# Patient Record
Sex: Male | Born: 1955 | Race: Black or African American | Hispanic: No | Marital: Married | Smoking: Never smoker
Health system: Southern US, Community
[De-identification: ages and names within clinical notes are randomized; demographics above are authoritative.]

## PROBLEM LIST (undated history)

## (undated) DIAGNOSIS — E119 Type 2 diabetes mellitus without complications: Secondary | ICD-10-CM

---

## 2015-07-24 ENCOUNTER — Encounter (HOSPITAL_COMMUNITY): Payer: Self-pay

## 2015-07-24 ENCOUNTER — Emergency Department (HOSPITAL_COMMUNITY)

## 2015-07-24 ENCOUNTER — Emergency Department (HOSPITAL_COMMUNITY)
Admission: EM | Admit: 2015-07-24 | Discharge: 2015-07-25 | Disposition: A | Discharge status: Home / Self Care | Attending: Emergency Medicine | Admitting: Emergency Medicine

## 2015-07-24 DIAGNOSIS — R300 Dysuria: Secondary | ICD-10-CM

## 2015-07-24 DIAGNOSIS — Z7982 Long term (current) use of aspirin: Secondary | ICD-10-CM | POA: Diagnosis not present

## 2015-07-24 DIAGNOSIS — E119 Type 2 diabetes mellitus without complications: Secondary | ICD-10-CM | POA: Insufficient documentation

## 2015-07-24 DIAGNOSIS — R000 Tachycardia, unspecified: Secondary | ICD-10-CM | POA: Diagnosis not present

## 2015-07-24 DIAGNOSIS — R1110 Vomiting, unspecified: Secondary | ICD-10-CM | POA: Diagnosis not present

## 2015-07-24 DIAGNOSIS — N419 Inflammatory disease of prostate, unspecified: Secondary | ICD-10-CM | POA: Insufficient documentation

## 2015-07-24 DIAGNOSIS — N390 Urinary tract infection, site not specified: Secondary | ICD-10-CM | POA: Diagnosis not present

## 2015-07-24 DIAGNOSIS — R32 Unspecified urinary incontinence: Secondary | ICD-10-CM | POA: Diagnosis present

## 2015-07-24 DIAGNOSIS — Z7984 Long term (current) use of oral hypoglycemic drugs: Secondary | ICD-10-CM | POA: Diagnosis not present

## 2015-07-24 DIAGNOSIS — Z79899 Other long term (current) drug therapy: Secondary | ICD-10-CM | POA: Insufficient documentation

## 2015-07-24 HISTORY — DX: Type 2 diabetes mellitus without complications: E11.9

## 2015-07-24 LAB — BASIC METABOLIC PANEL
Anion gap: 15 (ref 5–15)
BUN: 16 mg/dL (ref 6–20)
CALCIUM: 9.3 mg/dL (ref 8.9–10.3)
CO2: 21 mmol/L — ABNORMAL LOW (ref 22–32)
Chloride: 100 mmol/L — ABNORMAL LOW (ref 101–111)
Creatinine, Ser: 1.15 mg/dL (ref 0.61–1.24)
GFR calc Af Amer: >60 mL/min (ref >60)
GLUCOSE: 278 mg/dL — AB (ref 65–99)
Potassium: 3.7 mmol/L (ref 3.5–5.1)
Sodium: 136 mmol/L (ref 135–145)

## 2015-07-24 LAB — URINE MICROSCOPIC-ADD ON: SQUAMOUS EPITHELIAL / LPF: NONE SEEN

## 2015-07-24 LAB — I-STAT CHEM 8, ED
BUN: 14 mg/dL (ref 6–20)
CALCIUM ION: 1.1 mmol/L — AB (ref 1.12–1.23)
CHLORIDE: 97 mmol/L — AB (ref 101–111)
Creatinine, Ser: 1 mg/dL (ref 0.61–1.24)
GLUCOSE: 275 mg/dL — AB (ref 65–99)
HCT: 47 % (ref 39.0–52.0)
HEMOGLOBIN: 16 g/dL (ref 13.0–17.0)
Potassium: 3.5 mmol/L (ref 3.5–5.1)
Sodium: 135 mmol/L (ref 135–145)
TCO2: 22 mmol/L (ref 0–100)

## 2015-07-24 LAB — CBC WITH DIFFERENTIAL/PLATELET
Basophils Absolute: 0 10*3/uL (ref 0.0–0.1)
Basophils Relative: 0 %
EOS PCT: 0 %
Eosinophils Absolute: 0 10*3/uL (ref 0.0–0.7)
HCT: 40.9 % (ref 39.0–52.0)
Hemoglobin: 14.1 g/dL (ref 13.0–17.0)
LYMPHS ABS: 0.8 10*3/uL (ref 0.7–4.0)
LYMPHS PCT: 8 %
MCH: 29.3 pg (ref 26.0–34.0)
MCHC: 34.5 g/dL (ref 30.0–36.0)
MCV: 85 fL (ref 78.0–100.0)
MONO ABS: 0.5 10*3/uL (ref 0.1–1.0)
Monocytes Relative: 5 %
Neutro Abs: 8.8 10*3/uL — ABNORMAL HIGH (ref 1.7–7.7)
Neutrophils Relative %: 87 %
PLATELETS: 154 10*3/uL (ref 150–400)
RBC: 4.81 MIL/uL (ref 4.22–5.81)
RDW: 13.1 % (ref 11.5–15.5)
WBC: 10 10*3/uL (ref 4.0–10.5)

## 2015-07-24 LAB — URINALYSIS, ROUTINE W REFLEX MICROSCOPIC
Bilirubin Urine: NEGATIVE
Ketones, ur: >80 mg/dL — AB
Nitrite: POSITIVE — AB
Protein, ur: 100 mg/dL — AB
SPECIFIC GRAVITY, URINE: 1.04 — AB (ref 1.005–1.030)
pH: 5.5 (ref 5.0–8.0)

## 2015-07-24 MED ORDER — ACETAMINOPHEN 500 MG PO TABS
1000.0000 mg | ORAL_TABLET | Freq: Once | ORAL | Status: AC
  Administered 2015-07-24: 1000 mg via ORAL
  Filled 2015-07-24: qty 2

## 2015-07-24 MED ORDER — IOHEXOL 300 MG/ML  SOLN
25.0000 mL | Freq: Once | INTRAMUSCULAR | Status: AC | PRN
  Administered 2015-07-24: 25 mL via ORAL

## 2015-07-24 MED ORDER — SODIUM CHLORIDE 0.9 % IV BOLUS (SEPSIS)
1000.0000 mL | Freq: Once | INTRAVENOUS | Status: AC
  Administered 2015-07-25: 1000 mL via INTRAVENOUS

## 2015-07-24 MED ORDER — IBUPROFEN 800 MG PO TABS
800.0000 mg | ORAL_TABLET | Freq: Once | ORAL | Status: AC
  Administered 2015-07-24: 800 mg via ORAL
  Filled 2015-07-24: qty 1

## 2015-07-24 MED ORDER — IOHEXOL 300 MG/ML  SOLN
100.0000 mL | Freq: Once | INTRAMUSCULAR | Status: AC | PRN
  Administered 2015-07-24: 100 mL via INTRAVENOUS

## 2015-07-24 MED ORDER — LEVOFLOXACIN 750 MG PO TABS: 750.0000 mg | ORAL_TABLET | Freq: Every day | ORAL | Status: AC

## 2015-07-24 MED ORDER — SODIUM CHLORIDE 0.9 % IV BOLUS (SEPSIS)
1000.0000 mL | Freq: Once | INTRAVENOUS | Status: AC
  Administered 2015-07-24: 1000 mL via INTRAVENOUS

## 2015-07-24 MED ORDER — LEVOFLOXACIN 500 MG PO TABS
500.0000 mg | ORAL_TABLET | Freq: Once | ORAL | Status: AC
  Administered 2015-07-25: 500 mg via ORAL
  Filled 2015-07-24: qty 1

## 2015-07-24 MED ORDER — ONDANSETRON HCL 4 MG/2ML IJ SOLN
4.0000 mg | Freq: Once | INTRAMUSCULAR | Status: DC
  Filled 2015-07-24: qty 2

## 2015-07-24 NOTE — ED Notes (Signed)
Patient transported to CT 

## 2015-07-24 NOTE — Progress Notes (Signed)
Patient listed as not having a pcp or insurance.  Patient reports he has [employer] and his pcp is Dr[facility] A[facility] [place].  System updated.

## 2015-07-24 NOTE — Discharge Instructions (Signed)
Follow up with urology.  It is rare to have a UTI as a male, so you may need further workup.  Urinary Tract Infection Urinary tract infections (UTIs) can develop anywhere along your urinary tract. Your urinary tract is your body's drainage system for removing wastes and extra water. Your urinary tract includes two kidneys, two ureters, a bladder, and a urethra. Your kidneys are a pair of bean-shaped organs. Each kidney is about the size of your fist. They are located below your ribs, one on each side of your spine. CAUSES Infections are caused by microbes, which are microscopic organisms, including fungi, viruses, and bacteria. These organisms are so small that they can only be seen through a microscope. Bacteria are the microbes that most commonly cause UTIs. SYMPTOMS  Symptoms of UTIs may vary by age and gender of the patient and by the location of the infection. Symptoms in young women typically include a frequent and intense urge to urinate and a painful, burning feeling in the bladder or urethra during urination. Older women and men are more likely to be tired, shaky, and weak and have muscle aches and abdominal pain. A fever may mean the infection is in your kidneys. Other symptoms of a kidney infection include pain in your back or sides below the ribs, nausea, and vomiting. DIAGNOSIS To diagnose a UTI, your caregiver will ask you about your symptoms. Your caregiver will also ask you to provide a urine sample. The urine sample will be tested for bacteria and white blood cells. White blood cells are made by your body to help fight infection. TREATMENT  Typically, UTIs can be treated with medication. Because most UTIs are caused by a bacterial infection, they usually can be treated with the use of antibiotics. The choice of antibiotic and length of treatment depend on your symptoms and the type of bacteria causing your infection. HOME CARE INSTRUCTIONS  If you were prescribed antibiotics, take them  exactly as your caregiver instructs you. Finish the medication even if you feel better after you have only taken some of the medication.  Drink enough water and fluids to keep your urine clear or pale yellow.  Avoid caffeine, tea, and carbonated beverages. They tend to irritate your bladder.  Empty your bladder often. Avoid holding urine for long periods of time.  Empty your bladder before and after sexual intercourse.  After a bowel movement, women should cleanse from front to back. Use each tissue only once. SEEK MEDICAL CARE IF:   You have back pain.  You develop a fever.  Your symptoms do not begin to resolve within 3 days. SEEK IMMEDIATE MEDICAL CARE IF:   You have severe back pain or lower abdominal pain.  You develop chills.  You have nausea or vomiting.  You have continued burning or discomfort with urination. MAKE SURE YOU:   Understand these instructions.  Will watch your condition.  Will get help right away if you are not doing well or get worse.   This information is not intended to replace advice given to you by your health care provider. Make sure you discuss any questions you have with your health care provider.   Document Released: 03/19/2005 Document Revised: 02/28/2015 Document Reviewed: 07/18/2011 Elsevier Interactive Patient Education [employer]

## 2015-07-24 NOTE — ED Provider Notes (Signed)
CSN: [id number]     Arrival date & time 07/24/15  2102 History   First MD Initiated Contact with Patient 07/24/15 2126     Chief Complaint  Patient presents with  . Emesis  . Urinary Incontinence     (Consider location/radiation/quality/duration/timing/severity/associated sxs/prior Treatment) Patient is a 60 y.o. male presenting with vomiting and general illness. The history is provided by the patient.  Emesis Severity:  Moderate Timing:  Constant Progression:  Worsening Chronicity:  New Recent urination:  Normal Relieved by:  Nothing Worsened by:  Nothing tried Ineffective treatments:  None tried Associated symptoms: no abdominal pain, no arthralgias, no chills, no diarrhea, no headaches and no myalgias   Illness Severity:  Moderate Onset quality:  Sudden Duration:  4 days Timing:  Constant Progression:  Unchanged Chronicity:  New Associated symptoms: no abdominal pain, no chest pain, no congestion, no diarrhea, no fever, no headaches, no myalgias, no rash, no shortness of breath and no vomiting    60 yo M with a chief complaint of urinary incontinence. Patient states he gets the urgency and has to run to the bathroom if he doesn't make any PE is on himself. Patient is only doing a little bit of the time and feels like a meal he needs to be again. Patient has had some pain that wraps around to his lower back with the same thing. Has had fevers as well. Patient denies any chills. Denies any sick contacts. Denies any history of issues with his prostate gland. Patient denies recent sexual contact. Feel that he is low risk for sexual transmitted disease. Has had 2 in the past.   Past Medical History  Diagnosis Date  . Diabetes mellitus without complication (HCC)    History reviewed. No pertinent past surgical history. History reviewed. No pertinent family history. Social History  Substance Use Topics  . Smoking status: Never Smoker   . Smokeless tobacco: None  . Alcohol Use:  Yes    Review of Systems  Constitutional: Negative for fever and chills.  HENT: Negative for congestion and facial swelling.   Eyes: Negative for discharge and visual disturbance.  Respiratory: Negative for shortness of breath.   Cardiovascular: Negative for chest pain and palpitations.  Gastrointestinal: Negative for vomiting, abdominal pain and diarrhea.  Genitourinary: Positive for urgency, frequency and flank pain (bilateral lower). Negative for discharge, penile swelling and scrotal swelling.  Musculoskeletal: Negative for myalgias and arthralgias.  Skin: Negative for color change and rash.  Neurological: Negative for tremors, syncope and headaches.  Psychiatric/Behavioral: Negative for confusion and dysphoric mood.      Allergies  Review of patient's allergies indicates no known allergies.  Home Medications   Prior to Admission medications   Medication Sig Start Date End Date Taking? Authorizing Provider  Ascorbic Acid (VITAMIN C) POWD Take 1 Package by mouth daily as needed (prevent cold symptoms).   Yes Historical Provider, MD  aspirin EC 81 MG tablet Take 81 mg by mouth daily.   Yes Historical Provider, MD  bismuth subsalicylate (PEPTO BISMOL) 262 MG/15ML suspension Take 30 mLs by mouth every 6 (six) hours as needed for indigestion or diarrhea or loose stools.   Yes Historical Provider, MD  glipiZIDE (GLUCOTROL) 10 MG tablet Take 10 mg by mouth 2 (two) times daily before a meal.   Yes Historical Provider, MD  levofloxacin (LEVAQUIN) 750 MG tablet Take 1 tablet (750 mg total) by mouth daily. X 7 days 07/24/15   Melene Plan, DO  Liraglutide (VICTOZA) 18  MG/3ML SOPN Inject 18 mg into the skin daily.   Yes Historical Provider, MD  lisinopril (PRINIVIL,ZESTRIL) 40 MG tablet Take 40 mg by mouth daily.   Yes Historical Provider, MD  metFORMIN (GLUCOPHAGE) 1000 MG tablet Take 1,000 mg by mouth 2 (two) times daily with a meal.   Yes Historical Provider, MD  simvastatin (ZOCOR) 40 MG  tablet Take 40 mg by mouth daily.   Yes Historical Provider, MD   BP 134/85 mmHg  Pulse 109  Temp(Src) 99.9 F (37.7 C) (Rectal)  Resp 22  Ht  (1.727 m)  Wt 215 lb (97.523 kg)  BMI 32.70 kg/m2  SpO2 97% Physical Exam  Constitutional: He is oriented to person, place, and time. He appears well-developed and well-nourished.  HENT:  Head: Normocephalic and atraumatic.  Eyes: EOM are normal. Pupils are equal, round, and reactive to light.  Neck: Normal range of motion. Neck supple. No JVD present.  Cardiovascular: Regular rhythm.  Tachycardia present.  Exam reveals no gallop and no friction rub.   No murmur heard. Pulmonary/Chest: No respiratory distress. He has no wheezes.  Abdominal: He exhibits no distension. There is no tenderness. There is no rebound and no guarding.  Genitourinary:  No noted prostate tenderness   Musculoskeletal: Normal range of motion.  Neurological: He is alert and oriented to person, place, and time.  Skin: No rash noted. No pallor.  Psychiatric: He has a normal mood and affect. His behavior is normal.  Nursing note and vitals reviewed.   ED Course  Procedures (including critical care time) Labs Review Labs Reviewed  URINALYSIS, ROUTINE W REFLEX MICROSCOPIC (NOT AT [facility]) - Abnormal; Notable for the following:    Color, Urine AMBER (*)    APPearance TURBID (*)    Specific Gravity, Urine 1.040 (*)    Glucose, UA >1000 (*)    Hgb urine dipstick LARGE (*)    Ketones, ur >80 (*)    Protein, ur 100 (*)    Nitrite POSITIVE (*)    Leukocytes, UA SMALL (*)    All other components within normal limits  CBC WITH DIFFERENTIAL/PLATELET - Abnormal; Notable for the following:    Neutro Abs 8.8 (*)    All other components within normal limits  BASIC METABOLIC PANEL - Abnormal; Notable for the following:    Chloride 100 (*)    CO2 21 (*)    Glucose, Bld 278 (*)    All other components within normal limits  URINE MICROSCOPIC-ADD ON - Abnormal; Notable for  the following:    Bacteria, UA MANY (*)    All other components within normal limits  I-STAT CHEM 8, ED - Abnormal; Notable for the following:    Chloride 97 (*)    Glucose, Bld 275 (*)    Calcium, Ion 1.10 (*)    All other components within normal limits  CULTURE, BLOOD (ROUTINE X 2)  CULTURE, BLOOD (ROUTINE X 2)  URINE CULTURE  LACTIC ACID, PLASMA    Imaging Review Dg Chest 2 View  07/24/2015  CLINICAL DATA:  Fever. EXAM: CHEST  2 VIEW COMPARISON:  None. FINDINGS: Minimal scar or atelectasis at the left base. There is no edema, consolidation, effusion, or pneumothorax. Normal heart size and aortic contours. No acute osseous finding. IMPRESSION: No explanation for fever. Electronically Signed   By: Marnee Spring M.D.   On: 07/24/2015 23:07   Ct Abdomen Pelvis W Contrast  07/25/2015  CLINICAL DATA:  Lower abdominal pain with tachycardia and fever.  Episode of vomiting. Patient reports urinary incontinence. EXAM: CT ABDOMEN AND PELVIS WITH CONTRAST TECHNIQUE: Multidetector CT imaging of the abdomen and pelvis was performed using the standard protocol following bolus administration of intravenous contrast. CONTRAST:  25mL OMNIPAQUE IOHEXOL 300 MG/ML SOLN, OMNIPAQUE IOHEXOL 300 MG/ML SOLN COMPARISON:  None. FINDINGS: Lower chest:  The included lung bases are clear. Liver: Mild hepatic steatosis.  No focal hepatic lesion. Hepatobiliary: Gallbladder physiologically distended, no calcified stone. No biliary dilatation. Pancreas: No ductal dilatation or inflammation. Spleen: Normal. Adrenal glands: No nodule. Kidneys: Symmetric renal enhancement and excretion. No hydronephrosis. Bilobed versus 2 adjacent cysts in the anterior left kidney measure 2.4 cm. Additional parapelvic cyst is seen. Small cortical cyst in the right kidney. Stomach/Bowel: Stomach physiologically distended. There are no dilated or thickened small bowel loops. Moderate stool burden in the proximal colon. Wall thickening of the  sigmoid colon is likely reactive. The appendix is tentatively identified and normal. Vascular/Lymphatic: Small lymph nodes at the celiac bifurcation. No upper retroperitoneal adenopathy. Abdominal aorta is normal in caliber. Reproductive: Enlarged prostate gland measuring 6.8 cm transverse dimension. Left lobe appears edematous. Bladder: Decompressed but marked circumferential wall thickening. There is perivesicular and pelvic soft tissue stranding and inflammatory change. Other: No free air, free fluid, or intra-abdominal fluid collection. Small fat containing umbilical hernia. Musculoskeletal: There are no acute or suspicious osseous abnormalities. IMPRESSION: 1. Marked urinary bladder wall thickening. Enlarged prostate gland with edematous left lobe. There is associated perivesicular and pelvic soft tissue stranding and inflammation. Findings suggest cystitis and possible prostatitis. 2. Wall thickening of the sigmoid colon is likely reactive. 3. Incidental note of left renal cysts. Electronically Signed   By: Rubye Oaks M.D.   On: 07/25/2015 00:26   I have personally reviewed and evaluated these images and lab results as part of my medical decision-making.   EKG Interpretation None      MDM   Final diagnoses:  Dysuria  UTI (lower urinary tract infection)  Prostatitis, unspecified prostatitis type    60 yo M with a chief complaints of dysuria and increased frequency and hesitancy. Patient has never had a urinary tract infection before.  Prostate exam unremarkable. Patient has significant tachycardia heart rates in the 130s. Will give IV fluids Tylenol and ibuprofen as well as a CT scan with a profound tachycardia.  No elevated white count.  CXR negative for infection.  Awaiting CT and ua.   Discussed with Dr. Patria Mane, care turned over to him.    I have discussed the diagnosis/risks/treatment options with the patient and family and believe the pt to be eligible for discharge home to  follow-up with Urology. We also discussed returning to the ED immediately if new or worsening sx occur. We discussed the sx which are most concerning (e.g., sudden worsening pain, fever, inability to tolerate by mouth) that necessitate immediate return. Medications administered to the patient during their visit and any new prescriptions provided to the patient are listed below.  Medications given during this visit Medications  sodium chloride 0.9 % bolus 1,000 mL (0 mLs Intravenous Stopped 07/24/15 2338)  acetaminophen (TYLENOL) tablet 1,000 mg (1,000 mg Oral Given 07/24/15 2216)  ibuprofen (ADVIL,MOTRIN) tablet 800 mg (800 mg Oral Given 07/24/15 2216)  sodium chloride 0.9 % bolus 1,000 mL (0 mLs Intravenous Stopped 07/25/15 0155)  iohexol (OMNIPAQUE) 300 MG/ML solution 25 mL (25 mLs Oral Contrast Given 07/24/15 2215)  iohexol (OMNIPAQUE) 300 MG/ML solution 100 mL (100 mLs Intravenous Contrast Given 07/24/15 2344)  levofloxacin (LEVAQUIN) tablet 500 mg (500 mg Oral Given 07/25/15 0001)    Discharge Medication List as of 07/24/2015 11:46 PM      The patient appears reasonably screen and/or stabilized for discharge and I doubt any other medical condition or other [facility] requiring further screening, evaluation, or treatment in the ED at this time prior to discharge.    Melene Plan, DO 07/25/15 [phone]

## 2015-07-24 NOTE — ED Notes (Signed)
Pt complains of urinary incontinence since Sunday and vomiting since Monday, pt currently has a low grade fever

## 2015-07-25 LAB — LACTIC ACID, PLASMA: LACTIC ACID, VENOUS: 1.7 mmol/L (ref 0.5–2.0)

## 2015-07-25 NOTE — ED Provider Notes (Signed)
Question multiple prostatitis on CT scan.  Patient is on Levaquin.  This will treat both prostatitis and urinary tract infection.  Urine culture sent.  Lactate reassuring.  CT scan without other abnormality.  Discharge home in good condition.  Azalia Bilis, MD 07/25/15 [phone]

## 2015-07-25 NOTE — ED Notes (Signed)
Patient was alert, oriented and stable upon discharge. RN went over AVS and patient had no further questions.  

## 2015-07-27 LAB — URINE CULTURE: Culture: >=100000

## 2015-07-28 ENCOUNTER — Telehealth (HOSPITAL_BASED_OUTPATIENT_CLINIC_OR_DEPARTMENT_OTHER): Payer: Self-pay | Admitting: Emergency Medicine

## 2015-07-28 NOTE — Telephone Encounter (Signed)
Post ED Visit - Positive Culture Follow-up  Culture report reviewed by antimicrobial stewardship pharmacist:   Enzo Bi, Pharm.D.  Celedonio Miyamoto, Pharm.D., BCPS  Garvin Fila, Pharm.D.  Georgina Pillion, Pharm.D., BCPS  [place], [address].D., BCPS, AAHIVP  Estella Husk, Pharm.D., BCPS, AAHIVP  Cassie Stewart, Pharm.D.  Sherle Poe, Pharm.D.  Positive urine culture E. coli Treated with levofloxacin, organism sensitive to the same and no further patient follow-up is required at this time.  Berle Mull 07/28/2015, 2:02 PM

## 2015-07-30 LAB — CULTURE, BLOOD (ROUTINE X 2)
Culture: NO GROWTH
Culture: NO GROWTH

## 2017-07-12 IMAGING — CT CT ABD-PELV W/ CM
2 of 5 series · 16 of 46 positions shown, 18 images · IV contrast (OMNIPAQUE 300)
Comparison: None.

CLINICAL DATA: Lower abdominal pain with tachycardia and fever.

EXAM:
CT ABDOMEN AND PELVIS WITH CONTRAST
TECHNIQUE: Multidetector CT imaging of the abdomen and pelvis was performed
using the standard protocol following bolus administration of
intravenous contrast.
CONTRAST:  25mL OMNIPAQUE IOHEXOL 300 MG/ML SOLN, 100mL OMNIPAQUE
IOHEXOL 300 MG/ML SOLN

[Series 2: abd/pel with · axial · 0.74mm/px · z∈[+1079,+1534]mm · 13 of 103 slices shown, 15 images]
[im 6/103  soft-tissue]
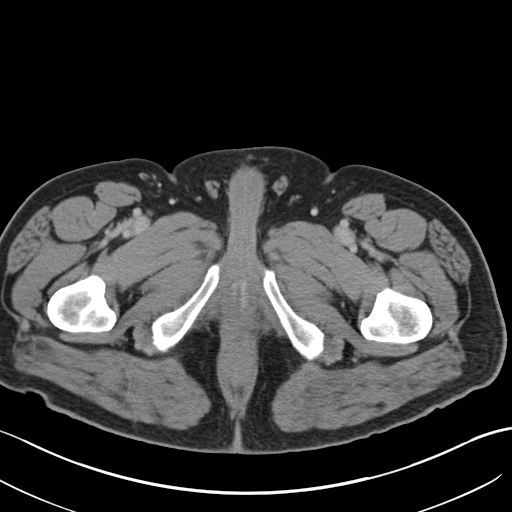
[im 6/103  bone]
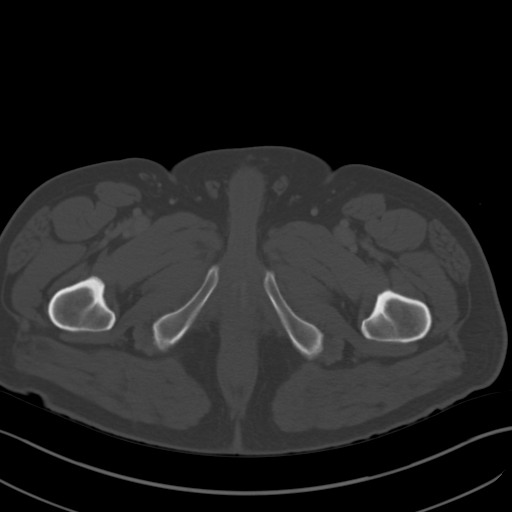
[im 16/103  soft-tissue]
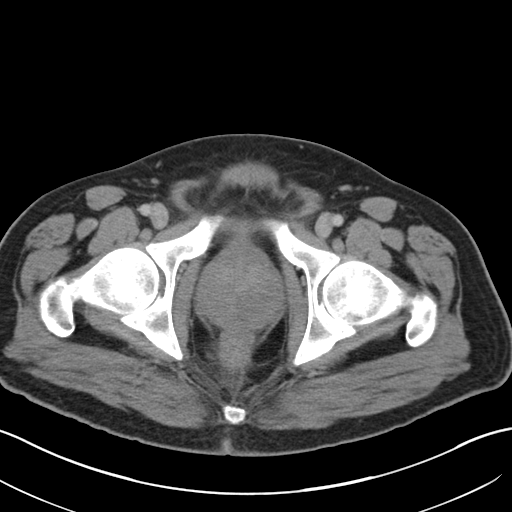
[im 21/103  soft-tissue]
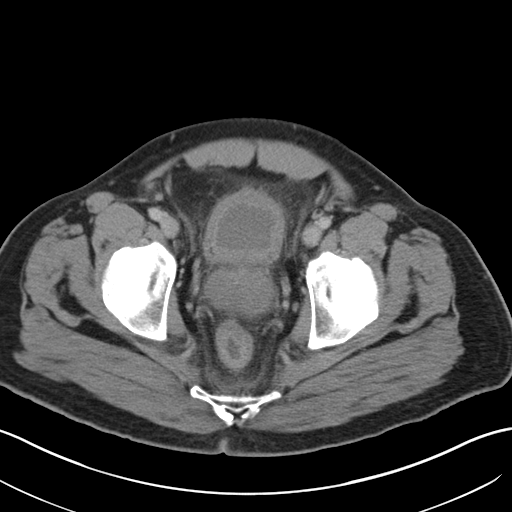
[im 31/103  soft-tissue]
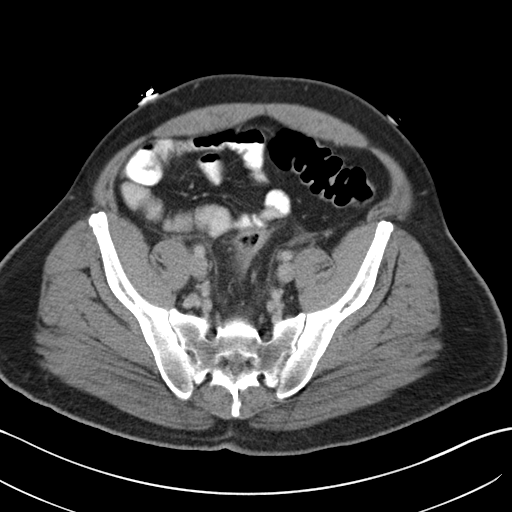
[im 36/103  soft-tissue]
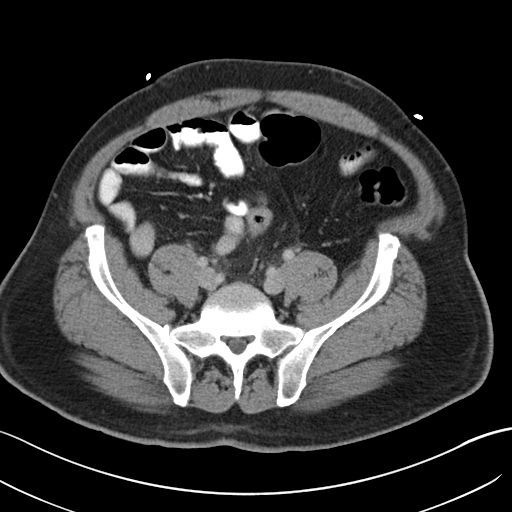
[im 46/103  soft-tissue]
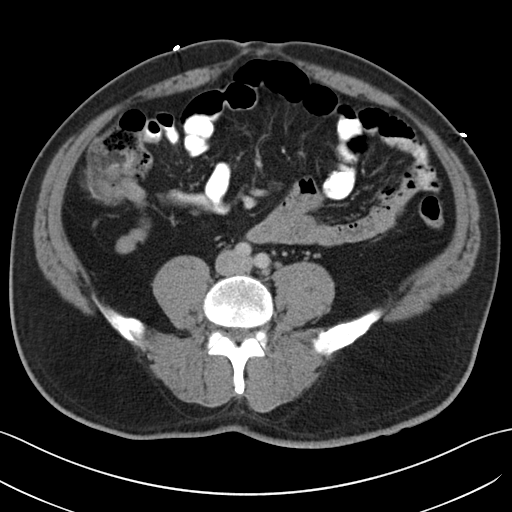
[im 52/103  soft-tissue]
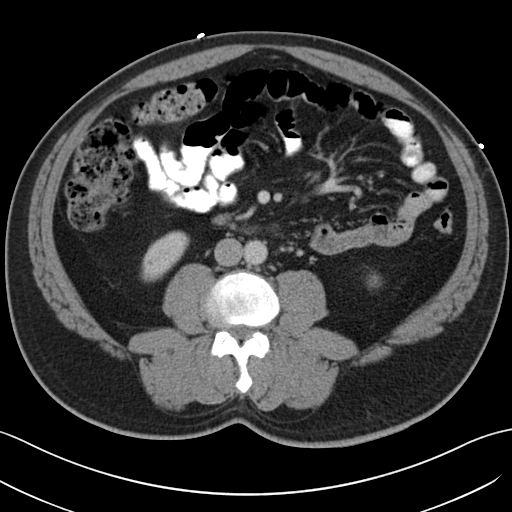
[im 57/103  soft-tissue]
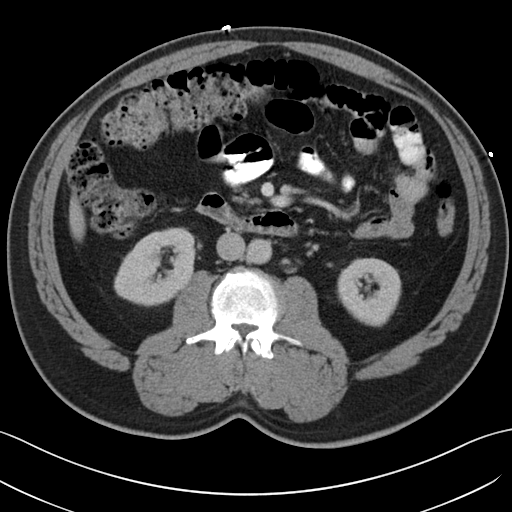
[im 67/103  soft-tissue]
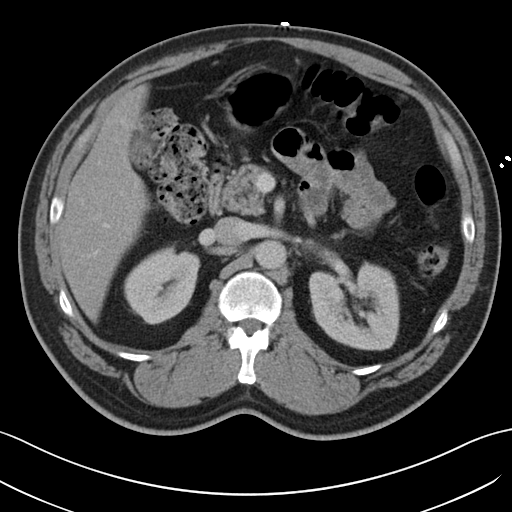
[im 67/103  bone]
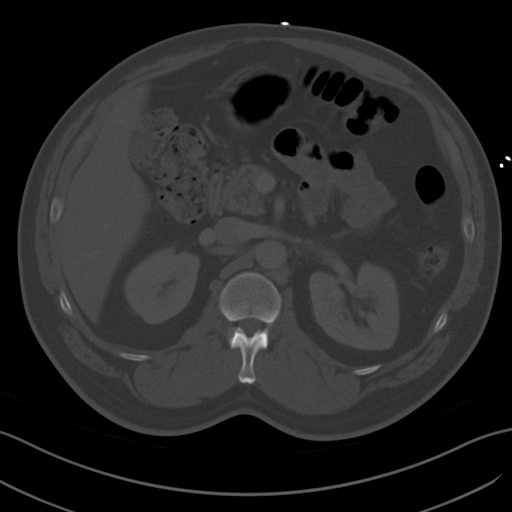
[im 72/103  soft-tissue]
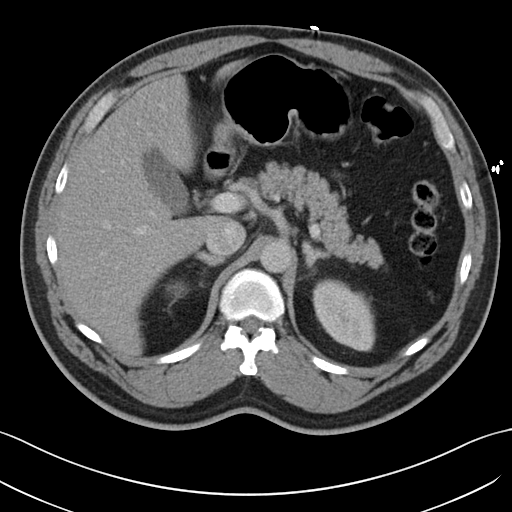
[im 82/103  soft-tissue]
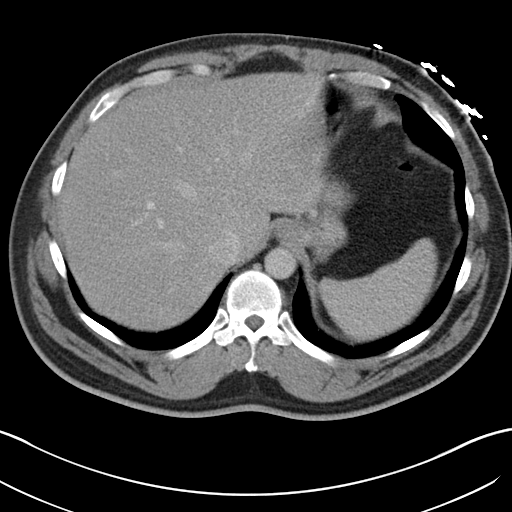
[im 87/103  soft-tissue]
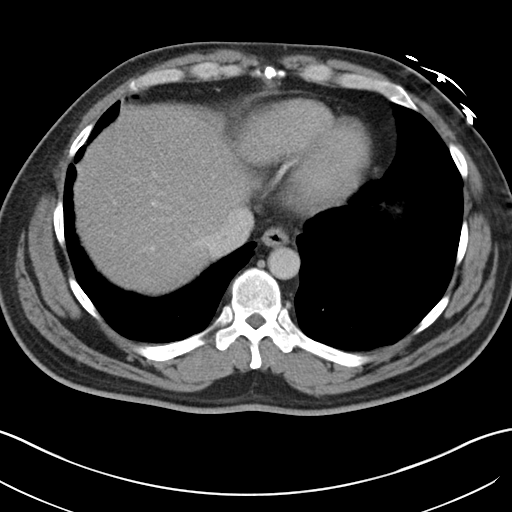
[im 97/103  soft-tissue]
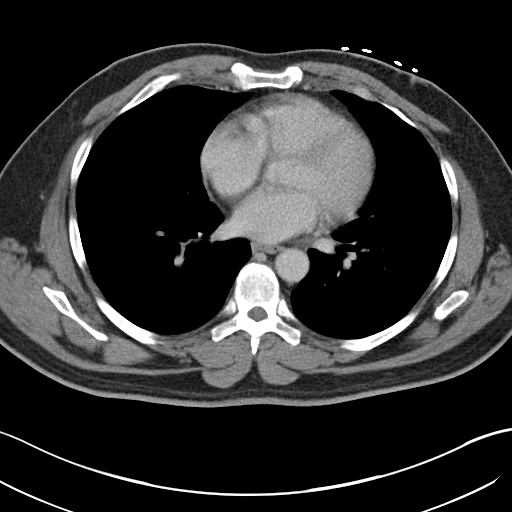

[Series 5: coronal a/|p · coronal · 0.77mm/px · 3 of 107 slices shown]
[im 36/107  soft-tissue]
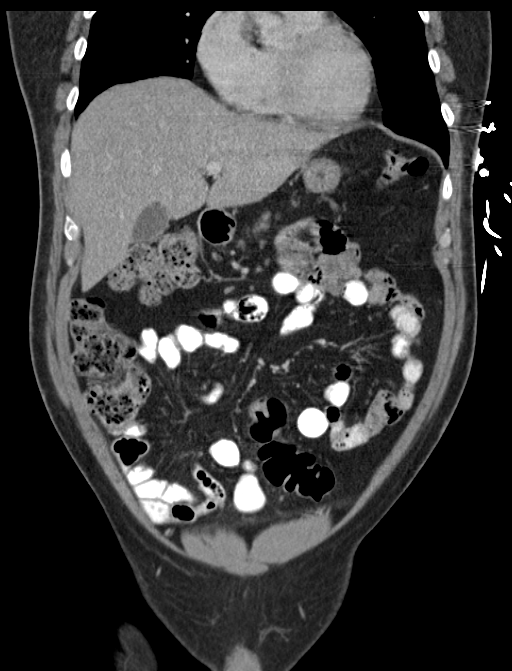
[im 48/107  soft-tissue]
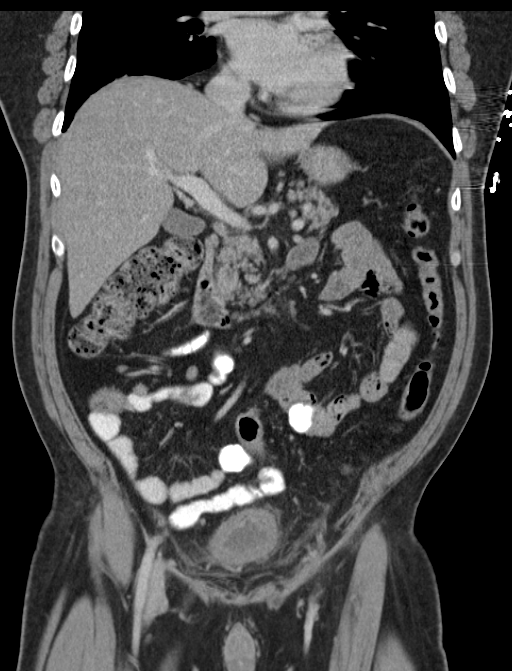
[im 59/107  soft-tissue]
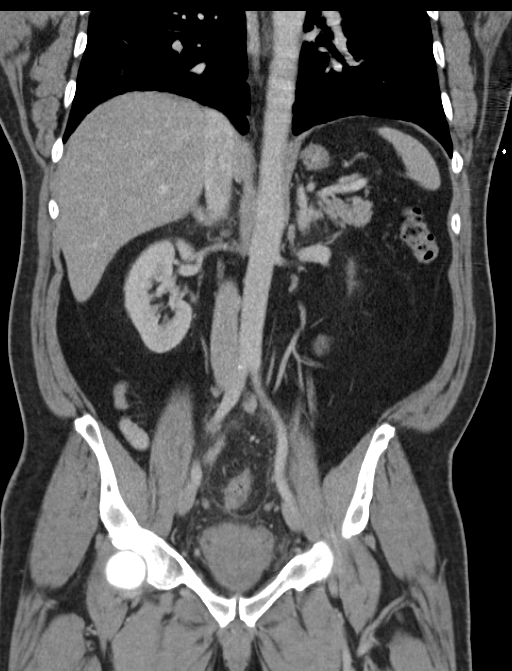

[16 of 46 positions shown; findings below may reference images not displayed]

FINDINGS: Lower chest:  The included lung bases are clear.

Liver: Mild hepatic steatosis.  No focal hepatic lesion.

Hepatobiliary: Gallbladder physiologically distended, no calcified
stone. No biliary dilatation.

Pancreas: No ductal dilatation or inflammation.

Spleen: Normal.

Adrenal glands: No nodule.

Kidneys: Symmetric renal enhancement and excretion. No
hydronephrosis. Bilobed versus 2 adjacent cysts in the anterior left
kidney measure 2.4 cm. Additional parapelvic cyst is seen. Small
cortical cyst in the right kidney.

Stomach/Bowel: Stomach physiologically distended. There are no
dilated or thickened small bowel loops. Moderate stool burden in the
proximal colon. Wall thickening of the sigmoid colon is likely
reactive. The appendix is tentatively identified and normal.

Vascular/Lymphatic: Small lymph nodes at the celiac bifurcation. No
upper retroperitoneal adenopathy. Abdominal aorta is normal in
caliber.

Reproductive: Enlarged prostate gland measuring 6.8 cm transverse
dimension. Left lobe appears edematous.

Bladder: Decompressed but marked circumferential wall thickening.
There is perivesicular and pelvic soft tissue stranding and
inflammatory change.

Other: No free air, free fluid, or intra-abdominal fluid collection.
Small fat containing umbilical hernia.

Musculoskeletal: There are no acute or suspicious osseous
abnormalities.
IMPRESSION: 1. Marked urinary bladder wall thickening. Enlarged prostate gland
with edematous left lobe. There is associated perivesicular and
pelvic soft tissue stranding and inflammation. Findings suggest
cystitis and possible prostatitis.
2. Wall thickening of the sigmoid colon is likely reactive.
3. Incidental note of left renal cysts.
